# Patient Record
Sex: Female | Born: 1965 | Race: White | Hispanic: No | Marital: Married
Health system: Southern US, Community
[De-identification: ages and names within clinical notes are randomized; demographics above are authoritative.]

## PROBLEM LIST (undated history)

## (undated) DIAGNOSIS — E119 Type 2 diabetes mellitus without complications: Secondary | ICD-10-CM

---

## 2020-09-05 ENCOUNTER — Encounter (HOSPITAL_COMMUNITY): Payer: Self-pay | Admitting: Emergency Medicine

## 2020-09-05 ENCOUNTER — Emergency Department (HOSPITAL_COMMUNITY): Payer: BLUE CROSS/BLUE SHIELD

## 2020-09-05 ENCOUNTER — Emergency Department (HOSPITAL_COMMUNITY)
Admission: EM | Admit: 2020-09-05 | Discharge: 2020-09-06 | Disposition: A | Discharge status: Home / Self Care | Payer: BLUE CROSS/BLUE SHIELD | Attending: Emergency Medicine | Admitting: Emergency Medicine

## 2020-09-05 ENCOUNTER — Other Ambulatory Visit: Payer: Self-pay

## 2020-09-05 DIAGNOSIS — Z794 Long term (current) use of insulin: Secondary | ICD-10-CM | POA: Diagnosis not present

## 2020-09-05 DIAGNOSIS — R0789 Other chest pain: Secondary | ICD-10-CM

## 2020-09-05 DIAGNOSIS — E119 Type 2 diabetes mellitus without complications: Secondary | ICD-10-CM | POA: Insufficient documentation

## 2020-09-05 HISTORY — DX: Type 2 diabetes mellitus without complications: E11.9

## 2020-09-05 LAB — CBC
HCT: 40.3 % (ref 36.0–46.0)
Hemoglobin: 13.4 g/dL (ref 12.0–15.0)
MCH: 30 pg (ref 26.0–34.0)
MCHC: 33.3 g/dL (ref 30.0–36.0)
MCV: 90.4 fL (ref 80.0–100.0)
Platelets: 256 10*3/uL (ref 150–400)
RBC: 4.46 MIL/uL (ref 3.87–5.11)
RDW: 12.4 % (ref 11.5–15.5)
WBC: 8.5 10*3/uL (ref 4.0–10.5)
nRBC: 0 % (ref 0.0–0.2)

## 2020-09-05 LAB — I-STAT BETA HCG BLOOD, ED (MC, WL, AP ONLY): I-stat hCG, quantitative: <5 m[IU]/mL (ref <5)

## 2020-09-05 LAB — HEPATIC FUNCTION PANEL
ALT: 21 U/L (ref 0–44)
AST: 28 U/L (ref 15–41)
Albumin: 3.7 g/dL (ref 3.5–5.0)
Alkaline Phosphatase: 40 U/L (ref 38–126)
Bilirubin, Direct: 0.1 mg/dL (ref 0.0–0.2)
Indirect Bilirubin: 0.5 mg/dL (ref 0.3–0.9)
Total Bilirubin: 0.6 mg/dL (ref 0.3–1.2)
Total Protein: 6.7 g/dL (ref 6.5–8.1)

## 2020-09-05 LAB — TROPONIN I (HIGH SENSITIVITY)
Troponin I (High Sensitivity): 11 ng/L (ref <18)
Troponin I (High Sensitivity): 12 ng/L (ref <18)

## 2020-09-05 LAB — BASIC METABOLIC PANEL
Anion gap: 9 (ref 5–15)
BUN: 12 mg/dL (ref 6–20)
CO2: 25 mmol/L (ref 22–32)
Calcium: 9.5 mg/dL (ref 8.9–10.3)
Chloride: 104 mmol/L (ref 98–111)
Creatinine, Ser: 0.86 mg/dL (ref 0.44–1.00)
GFR, Estimated: >60 mL/min (ref >60)
Glucose, Bld: 189 mg/dL — ABNORMAL HIGH (ref 70–99)
Potassium: 3.9 mmol/L (ref 3.5–5.1)
Sodium: 138 mmol/L (ref 135–145)

## 2020-09-05 LAB — LIPASE, BLOOD: Lipase: 44 U/L (ref 11–51)

## 2020-09-05 LAB — D-DIMER, QUANTITATIVE: D-Dimer, Quant: 0.37 ug/mL-FEU (ref 0.00–0.50)

## 2020-09-05 MED ORDER — LIDOCAINE 5 % EX PTCH: 1.0000 | MEDICATED_PATCH | Freq: Once | CUTANEOUS | Status: DC

## 2020-09-05 MED ORDER — FAMOTIDINE 20 MG PO TABS: 20.0000 mg | ORAL_TABLET | Freq: Two times a day (BID) | ORAL | 0 refills | Status: AC

## 2020-09-05 MED ORDER — IBUPROFEN 400 MG PO TABS
400.0000 mg | ORAL_TABLET | Freq: Once | ORAL | Status: AC | PRN
  Administered 2020-09-05: 400 mg via ORAL
  Filled 2020-09-05: qty 1

## 2020-09-05 MED ORDER — SUCRALFATE 1 G PO TABS: 1.0000 g | ORAL_TABLET | Freq: Three times a day (TID) | ORAL | 0 refills | Status: AC

## 2020-09-05 NOTE — ED Provider Notes (Signed)
[facility] EMERGENCY DEPARTMENT Provider Note   CSN: [id number] Arrival date & time: 09/05/20  1431     History Chief Complaint  Patient presents with  . Chest Pain    Karla Dalton is a 54 y.o. female.  HPI    Patient presents with concern of chest pain.  More accurately, the patient has pain in the sternal area with radiation posteriorly to the region between her scapula. She notes that she has had pain on and off for the past month, in this distribution. She denies preceding trauma, fall, lifting, accident. Pain has been on, off, enough that she has seen her physician, has had outpatient testing, including ultrasound of her gallbladder.  No cardiac testing thus far. Patient does not smoke, does not drink. 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 No known cardiac history. Today, with worsening of the pain, in a typical distribution, sternal, intrascapular, severe, she presents for evaluation. Pain is improved with sublingual nitroglycerin provided soon after arrival.  Past Medical History:  Diagnosis Date  . Diabetes mellitus without complication (HCC)     There are no problems to display for this patient.   History reviewed. No pertinent surgical history.   OB History   No obstetric history on file.     No family history on file.  Social History   Tobacco Use  . Smoking status: Not on file  Substance Use Topics  . Alcohol use: Not on file  . Drug use: Not on file    Home Medications Prior to Admission medications   Medication Sig Start Date End Date Taking? Authorizing Provider  acetaminophen (TYLENOL) 500 MG tablet Take 1,000 mg by mouth every 8 (eight) hours as needed for moderate pain.   Yes [provider]  Insulin Regular Human (NOVOLIN R FLEXPEN) 100 UNIT/ML SOPN Inject 15 Units into the muscle in the morning and at bedtime. 07/24/20  Yes [provider]  TOUJEO MAX SOLOSTAR 300 UNIT/ML Solostar Pen  Inject 36 Units into the skin every morning. 08/14/20  Yes [provider]  famotidine (PEPCID) 20 MG tablet Take 1 tablet (20 mg total) by mouth 2 (two) times daily. 09/05/20   Gerhard Munch, MD  sucralfate (CARAFATE) 1 g tablet Take 1 tablet (1 g total) by mouth 4 (four) times daily -  with meals and at bedtime. 09/05/20   Gerhard Munch, MD    Allergies    Penicillins, Sulfa antibiotics, and Aspirin  Review of Systems   Review of Systems  Constitutional:       Per HPI, otherwise negative  HENT:       Per HPI, otherwise negative  Respiratory:       Per HPI, otherwise negative  Cardiovascular:       Per HPI, otherwise negative  Gastrointestinal: Negative for vomiting.  Endocrine:       Negative aside from HPI  Genitourinary:       Neg aside from HPI   Musculoskeletal:       Per HPI, otherwise negative  Skin: Negative.   Neurological: Negative for syncope.    Physical Exam Updated Vital Signs BP 136/75   Pulse (!) 53   Temp 97.9 F (36.6 C) (Oral)   Resp 15   LMP 09/18/2019 (Approximate) Comment: approximate-per pt  SpO2 98%   Physical Exam Vitals and nursing note reviewed.  Constitutional:      General: She is not in acute distress.    Appearance: She is well-developed.  She is obese. She is not ill-appearing or diaphoretic.  HENT:     Head: Normocephalic and atraumatic.  Eyes:     Conjunctiva/sclera: Conjunctivae normal.  Cardiovascular:     Rate and Rhythm: Normal rate and regular rhythm.  Pulmonary:     Effort: Pulmonary effort is normal. No respiratory distress.     Breath sounds: Normal breath sounds. No stridor.  Abdominal:     General: There is no distension.  Skin:    General: Skin is warm and dry.  Neurological:     Mental Status: She is alert and oriented to person, place, and time.     Cranial Nerves: No cranial nerve deficit.     ED Results / Procedures / Treatments   Labs (all labs ordered are listed, but only abnormal  results are displayed) Labs Reviewed  BASIC METABOLIC PANEL - Abnormal; Notable for the following components:      Result Value   Glucose, Bld 189 (*)    All other components within normal limits  CBC  D-DIMER, QUANTITATIVE (NOT AT [facility])  LIPASE, BLOOD  HEPATIC FUNCTION PANEL  I-STAT BETA HCG BLOOD, ED ([facility], [facility], [facility] ONLY)  TROPONIN I (HIGH SENSITIVITY)  TROPONIN I (HIGH SENSITIVITY)    EKG EKG Interpretation  Date/Time:  Friday September 05 2020 14:27:55 EST Ventricular Rate:  59 PR Interval:  164 QRS Duration: 82 QT Interval:  416 QTC Calculation: 411 R Axis:   -27 Text Interpretation: Sinus bradycardia Minimal voltage criteria for LVH, may be normal variant ( R in aVL ) Borderline ECG Confirmed by Gerhard Munch [phone]) on 09/05/2020 8:21:44 PM   Radiology DG Chest 2 View  Result Date: 09/05/2020 CLINICAL DATA:  54 year old female with chest pain. EXAM: CHEST - 2 VIEW COMPARISON:  None. FINDINGS: The heart size and mediastinal contours are within normal limits. Both lungs are clear. The visualized skeletal structures are unremarkable. IMPRESSION: No active cardiopulmonary disease. Electronically Signed   By: Elgie Collard M.D.   On: 09/05/2020 15:53   CT Thoracic Spine Wo Contrast  Result Date: 09/05/2020 CLINICAL DATA:  Initial evaluation for possible compression fracture. EXAM: CT THORACIC SPINE WITHOUT CONTRAST TECHNIQUE: Multidetector CT images of the thoracic were obtained using the standard protocol without intravenous contrast. COMPARISON:  None available. Prior radiograph from earlier same day. FINDINGS: Alignment: Vertebral bodies normally aligned with preservation of the normal thoracic kyphosis. No listhesis. Vertebrae: Vertebral body height maintained without acute or chronic fracture. No discrete or worrisome osseous lesions. Visualized ribs intact. Paraspinal and other soft tissues: Paraspinous soft tissues demonstrate no acute finding. Partially visualized lungs  are clear. Disc levels: T6-7: Small right central disc osteophyte complex indents the right ventral thecal sac without significant stenosis. Foramina remain patent. T11-12: Broad-based central disc osteophyte complex flattens the ventral thecal sac. Suspected mild spinal stenosis. Foramina remain patent. No other significant disc pathology seen within the thoracic spine. No other significant stenosis. Additional scattered reactive endplate spurring noted throughout the mid and lower thoracic spine. IMPRESSION: 1. No acute fracture or other abnormality within the thoracic spine. 2. Broad-based central disc osteophyte complex at T11-12 with resultant mild spinal stenosis. 3. Small right central disc osteophyte complex at T6-7 without significant stenosis. Electronically Signed   By: Rise Mu M.D.   On: 09/05/2020 23:07    Procedures Procedures (including critical care time)  Medications Ordered in ED Medications  lidocaine (LIDODERM) 5 % 1 patch (has no administration in time range)  ibuprofen (ADVIL) tablet 400  mg (400 mg Oral Given 09/05/20 1655)    ED Course  I have reviewed the triage vital signs and the nursing notes.  Pertinent labs & imaging results that were available during my care of the patient were reviewed by me and considered in my medical decision making (see chart for details).  Update:, After initial x-ray was reviewed, discussed, patient continues to complain of pain. With consideration of possible compression fracture, CT scan ordered. 11:29 PM Patient in no distress, hemodynamically unremarkable. Now I have reviewed the CT images, the remaining labs. Labs notable for unremarkable troponin values, and with nonischemic EKG, duration of symptoms, low suspicion for atypical ACS. D-dimer negative, low suspicion for PE or dissection. Lipase negative, no evidence for hepatobiliary dysfunction. On the labs reassuring. Some suspicion for the patient's pain being  gastroesophageal.  She is not currently taking a regimen, will start this, will follow up with gastroenterology.  Final Clinical Impression(s) / ED Diagnoses Final diagnoses:  Atypical chest pain    Rx / DC Orders ED Discharge Orders         Ordered    sucralfate (CARAFATE) 1 g tablet  3 times daily with meals & bedtime       Note to Pharmacy: Take for one week   09/05/20 2328    famotidine (PEPCID) 20 MG tablet  2 times daily        09/05/20 2328           Gerhard Munch, MD 09/05/20 2330

## 2020-09-05 NOTE — ED Triage Notes (Signed)
Pt arrives via [facility] from work with c/o sudden onset of chest pain that radiated to her back. Denies sob or radiation of pain to arms. BP 193/100 initially then 163/87 with 1 sl nitro. No asa due to allergy. Normal 12 lead ekg. 18g IV L arm.

## 2020-09-05 NOTE — Discharge Instructions (Signed)
As discussed, your evaluation today has been largely reassuring.  But, it is important that you monitor your condition carefully, and do not hesitate to return to the ED if you develop new, or concerning changes in your condition. ? ?Otherwise, please follow-up with your physician for appropriate ongoing care. ? ?

## 2020-09-06 ENCOUNTER — Other Ambulatory Visit: Payer: Self-pay

## 2020-09-09 ENCOUNTER — Encounter (HOSPITAL_COMMUNITY): Payer: Self-pay

## 2020-09-09 ENCOUNTER — Other Ambulatory Visit: Payer: Self-pay

## 2020-09-09 ENCOUNTER — Emergency Department (HOSPITAL_COMMUNITY): Payer: BLUE CROSS/BLUE SHIELD

## 2020-09-09 ENCOUNTER — Emergency Department (HOSPITAL_COMMUNITY)
Admission: EM | Admit: 2020-09-09 | Discharge: 2020-09-09 | Disposition: A | Discharge status: Home / Self Care | Payer: BLUE CROSS/BLUE SHIELD | Attending: Emergency Medicine | Admitting: Emergency Medicine

## 2020-09-09 DIAGNOSIS — E119 Type 2 diabetes mellitus without complications: Secondary | ICD-10-CM | POA: Diagnosis not present

## 2020-09-09 DIAGNOSIS — R079 Chest pain, unspecified: Secondary | ICD-10-CM | POA: Diagnosis present

## 2020-09-09 DIAGNOSIS — Z794 Long term (current) use of insulin: Secondary | ICD-10-CM | POA: Insufficient documentation

## 2020-09-09 DIAGNOSIS — R0789 Other chest pain: Secondary | ICD-10-CM | POA: Insufficient documentation

## 2020-09-09 LAB — CBC
HCT: 40.6 % (ref 36.0–46.0)
Hemoglobin: 13.1 g/dL (ref 12.0–15.0)
MCH: 29.5 pg (ref 26.0–34.0)
MCHC: 32.3 g/dL (ref 30.0–36.0)
MCV: 91.4 fL (ref 80.0–100.0)
Platelets: 242 10*3/uL (ref 150–400)
RBC: 4.44 MIL/uL (ref 3.87–5.11)
RDW: 12.4 % (ref 11.5–15.5)
WBC: 8 10*3/uL (ref 4.0–10.5)
nRBC: 0 % (ref 0.0–0.2)

## 2020-09-09 LAB — BASIC METABOLIC PANEL
Anion gap: 11 (ref 5–15)
BUN: 11 mg/dL (ref 6–20)
CO2: 24 mmol/L (ref 22–32)
Calcium: 9.2 mg/dL (ref 8.9–10.3)
Chloride: 104 mmol/L (ref 98–111)
Creatinine, Ser: 0.81 mg/dL (ref 0.44–1.00)
GFR, Estimated: >60 mL/min (ref >60)
Glucose, Bld: 201 mg/dL — ABNORMAL HIGH (ref 70–99)
Potassium: 3.9 mmol/L (ref 3.5–5.1)
Sodium: 139 mmol/L (ref 135–145)

## 2020-09-09 LAB — TROPONIN I (HIGH SENSITIVITY)
Troponin I (High Sensitivity): 11 ng/L (ref <18)
Troponin I (High Sensitivity): 12 ng/L (ref <18)

## 2020-09-09 LAB — CBG MONITORING, ED: Glucose-Capillary: 123 mg/dL — ABNORMAL HIGH (ref 70–99)

## 2020-09-09 MED ORDER — ACETAMINOPHEN 500 MG PO TABS
1000.0000 mg | ORAL_TABLET | Freq: Once | ORAL | Status: AC
  Administered 2020-09-09: 1000 mg via ORAL
  Filled 2020-09-09: qty 2

## 2020-09-09 MED ORDER — SODIUM CHLORIDE 0.9 % IV BOLUS
1000.0000 mL | Freq: Once | INTRAVENOUS | Status: AC
  Administered 2020-09-09: 1000 mL via INTRAVENOUS

## 2020-09-09 NOTE — ED Triage Notes (Signed)
Pt reports chest pressure that radiates to her back along with HTN up to 200. Pt seen last week for the same and has a follow up appointment with cardiology next week. Pt anxious in triage

## 2020-09-09 NOTE — Discharge Instructions (Addendum)
Please follow-up with your primary care provider for further management of your condition.  Follow-up with GI specialist as previously recommended.  Return to the ED if you have any concern however your evaluation in the ED today have been reassuring.

## 2020-09-09 NOTE — ED Provider Notes (Signed)
[facility] EMERGENCY DEPARTMENT Provider Note   CSN: [id number] Arrival date & time: 09/09/20  1319     History Chief Complaint  Patient presents with  . Chest Pain  . Hypertension    Karla Dalton is a 54 y.o. female.  The history is provided by the patient and medical records. No language interpreter was used.  Chest Pain Hypertension Associated symptoms include chest pain.     54 year old female significant history of diabetes presenting for evaluation of chest pain. Patient reports several days ago while at work she had a syncopal episode. She was brought to the ED for evaluation and was found to be hypertensive with systolic blood pressure in the 200s. Evaluation during her visit include a D-dimer that was negative therefore low suspicion for PE or dissection, her EKG and troponin was unremarkable, low suspicion for atypical ACS, CT scan of her chest was reassuring. It was thought that her symptoms may be secondary to gastroesophageal source. Patient discharged home with Carafate and a referral for gastroenterology. Today while at work she experienced a near syncopal episode with the same pain in his chest. She described pain as a substernal sensation, radiates towards her back, intense, and also report that her blood pressure was high. She voiced concern for high blood pressure thus prompting this ER visit. 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 She mention history of diabetes which is well controlled. She denies any recent injury. She has not been vaccinated for COVID-19.  Past Medical History:  Diagnosis Date  . Diabetes mellitus without complication (HCC)     There are no problems to display for this patient.   History reviewed. No pertinent surgical history.   OB History   No obstetric history on file.     No family history on file.  Social History   Tobacco Use  . Smoking status: Not on file  Substance  Use Topics  . Alcohol use: Not on file  . Drug use: Not on file    Home Medications Prior to Admission medications   Medication Sig Start Date End Date Taking? Authorizing Provider  acetaminophen (TYLENOL) 500 MG tablet Take 1,000 mg by mouth every 8 (eight) hours as needed for moderate pain.    [provider]  famotidine (PEPCID) 20 MG tablet Take 1 tablet (20 mg total) by mouth 2 (two) times daily. 09/05/20   Gerhard Munch, MD  Insulin Regular Human (NOVOLIN R FLEXPEN) 100 UNIT/ML SOPN Inject 15 Units into the muscle in the morning and at bedtime. 07/24/20   [provider]  sucralfate (CARAFATE) 1 g tablet Take 1 tablet (1 g total) by mouth 4 (four) times daily -  with meals and at bedtime. 09/05/20   Gerhard Munch, MD  TOUJEO MAX SOLOSTAR 300 UNIT/ML Solostar Pen Inject 36 Units into the skin every morning. 08/14/20   [provider]    Allergies    Penicillins, Sulfa antibiotics, and Aspirin  Review of Systems   Review of Systems  Cardiovascular: Positive for chest pain.  All other systems reviewed and are negative.   Physical Exam Updated Vital Signs BP 139/69   Pulse 63   Temp 98.1 F (36.7 C) (Oral)   Resp 20   Ht 5\' 7"  (1.702 m)   Wt 106.6 kg   SpO2 100%   BMI 36.81 kg/m   Physical Exam Vitals and nursing note reviewed.  Constitutional:  General: She is not in acute distress.    Appearance: She is well-developed.  HENT:     Head: Atraumatic.  Eyes:     Conjunctiva/sclera: Conjunctivae normal.  Neck:     Thyroid: No thyromegaly.  Cardiovascular:     Rate and Rhythm: Normal rate and regular rhythm.     Heart sounds: Normal heart sounds.  Pulmonary:     Effort: Pulmonary effort is normal.     Breath sounds: Normal breath sounds. No decreased breath sounds, wheezing, rhonchi or rales.  Chest:     Chest wall: No tenderness.  Abdominal:     Palpations: Abdomen is soft.     Tenderness: There is no abdominal tenderness.    Musculoskeletal:        General: Normal range of motion.     Cervical back: Normal range of motion and neck supple.     Right lower leg: No edema.     Left lower leg: No edema.  Skin:    Findings: No rash.  Neurological:     Mental Status: She is alert and oriented to person, place, and time.     GCS: GCS eye subscore is 4. GCS verbal subscore is 5. GCS motor subscore is 6.     ED Results / Procedures / Treatments   Labs (all labs ordered are listed, but only abnormal results are displayed) Labs Reviewed  BASIC METABOLIC PANEL - Abnormal; Notable for the following components:      Result Value   Glucose, Bld 201 (*)    All other components within normal limits  CBG MONITORING, ED - Abnormal; Notable for the following components:   Glucose-Capillary 123 (*)    All other components within normal limits  CBC  TROPONIN I (HIGH SENSITIVITY)  TROPONIN I (HIGH SENSITIVITY)    EKG None   Date: 09/09/2020  Rate: 63  Rhythm: normal sinus rhythm  QRS Axis: normal  Intervals: normal  ST/T Wave abnormalities: normal  Conduction Disutrbances: none  Narrative Interpretation:   Old EKG Reviewed: No significant changes noted     Radiology DG Chest 2 View  Result Date: 09/09/2020 CLINICAL DATA:  Chest pain EXAM: CHEST - 2 VIEW COMPARISON:  09/05/2020 FINDINGS: The heart size and mediastinal contours are within normal limits. No focal airspace consolidation, pleural effusion, or pneumothorax. The visualized skeletal structures are unremarkable. IMPRESSION: No active cardiopulmonary disease. Electronically Signed   By: Duanne Guess D.O.   On: 09/09/2020 14:00    Procedures Procedures (including critical care time)  Medications Ordered in ED Medications  sodium chloride 0.9 % bolus 1,000 mL (1,000 mLs Intravenous New Bag/Given 09/09/20 1815)  acetaminophen (TYLENOL) tablet 1,000 mg (1,000 mg Oral Given 09/09/20 1819)    ED Course  I have reviewed the triage vital signs  and the nursing notes.  Pertinent labs & imaging results that were available during my care of the patient were reviewed by me and considered in my medical decision making (see chart for details).    MDM Rules/Calculators/A&P                          BP 139/69   Pulse 63   Temp 98.1 F (36.7 C) (Oral)   Resp 20   Ht 5\' 7"  (1.702 m)   Wt 106.6 kg   SpO2 100%   BMI 36.81 kg/m   Final Clinical Impression(s) / ED Diagnoses Final diagnoses:  Atypical chest pain  Rx / DC Orders ED Discharge Orders    None     5:48 PM Patient here with complaints of chest pain, near syncope, and elevated blood pressure. Was seen several days prior for similar complaint and had a pretty thorough work-up that was unremarkable. Felt symptoms may be GI related and patient does have an appointment to follow-up with gastroenterology. She is here again with concerning for potential heart injuries causing her symptoms. She is concerned that her blood pressure is too high. Her current blood pressure is 139/69 without any specific treatment. Vital signs stable. Labs are reassuring.  6:25 PM EKG without concerning changes, normal troponin, electrolyte panels are reassuring, normal H&H, normal WBC,Chest x-ray unremarkable and vital signs stable.  Normal orthostatic vital sign.  Patient was given Tylenol for her headache.  She also received IV fluid while waiting.  6:32 PM Encourage patient to follow-up with PCP for further evaluation.  She can also follow-up with GI specialist as previously recommended.  Return precaution discussed.   Fayrene Helper, PA-C 09/09/20 1832    Gwyneth Sprout, MD 09/10/20 [phone]

## 2021-09-16 IMAGING — DX DG CHEST 2V
2 series · 2 of 2 positions shown · non-contrast
Comparison: None.

CLINICAL DATA: 54-year-old female with chest pain.

EXAM:
CHEST - 2 VIEW

[chest pa]
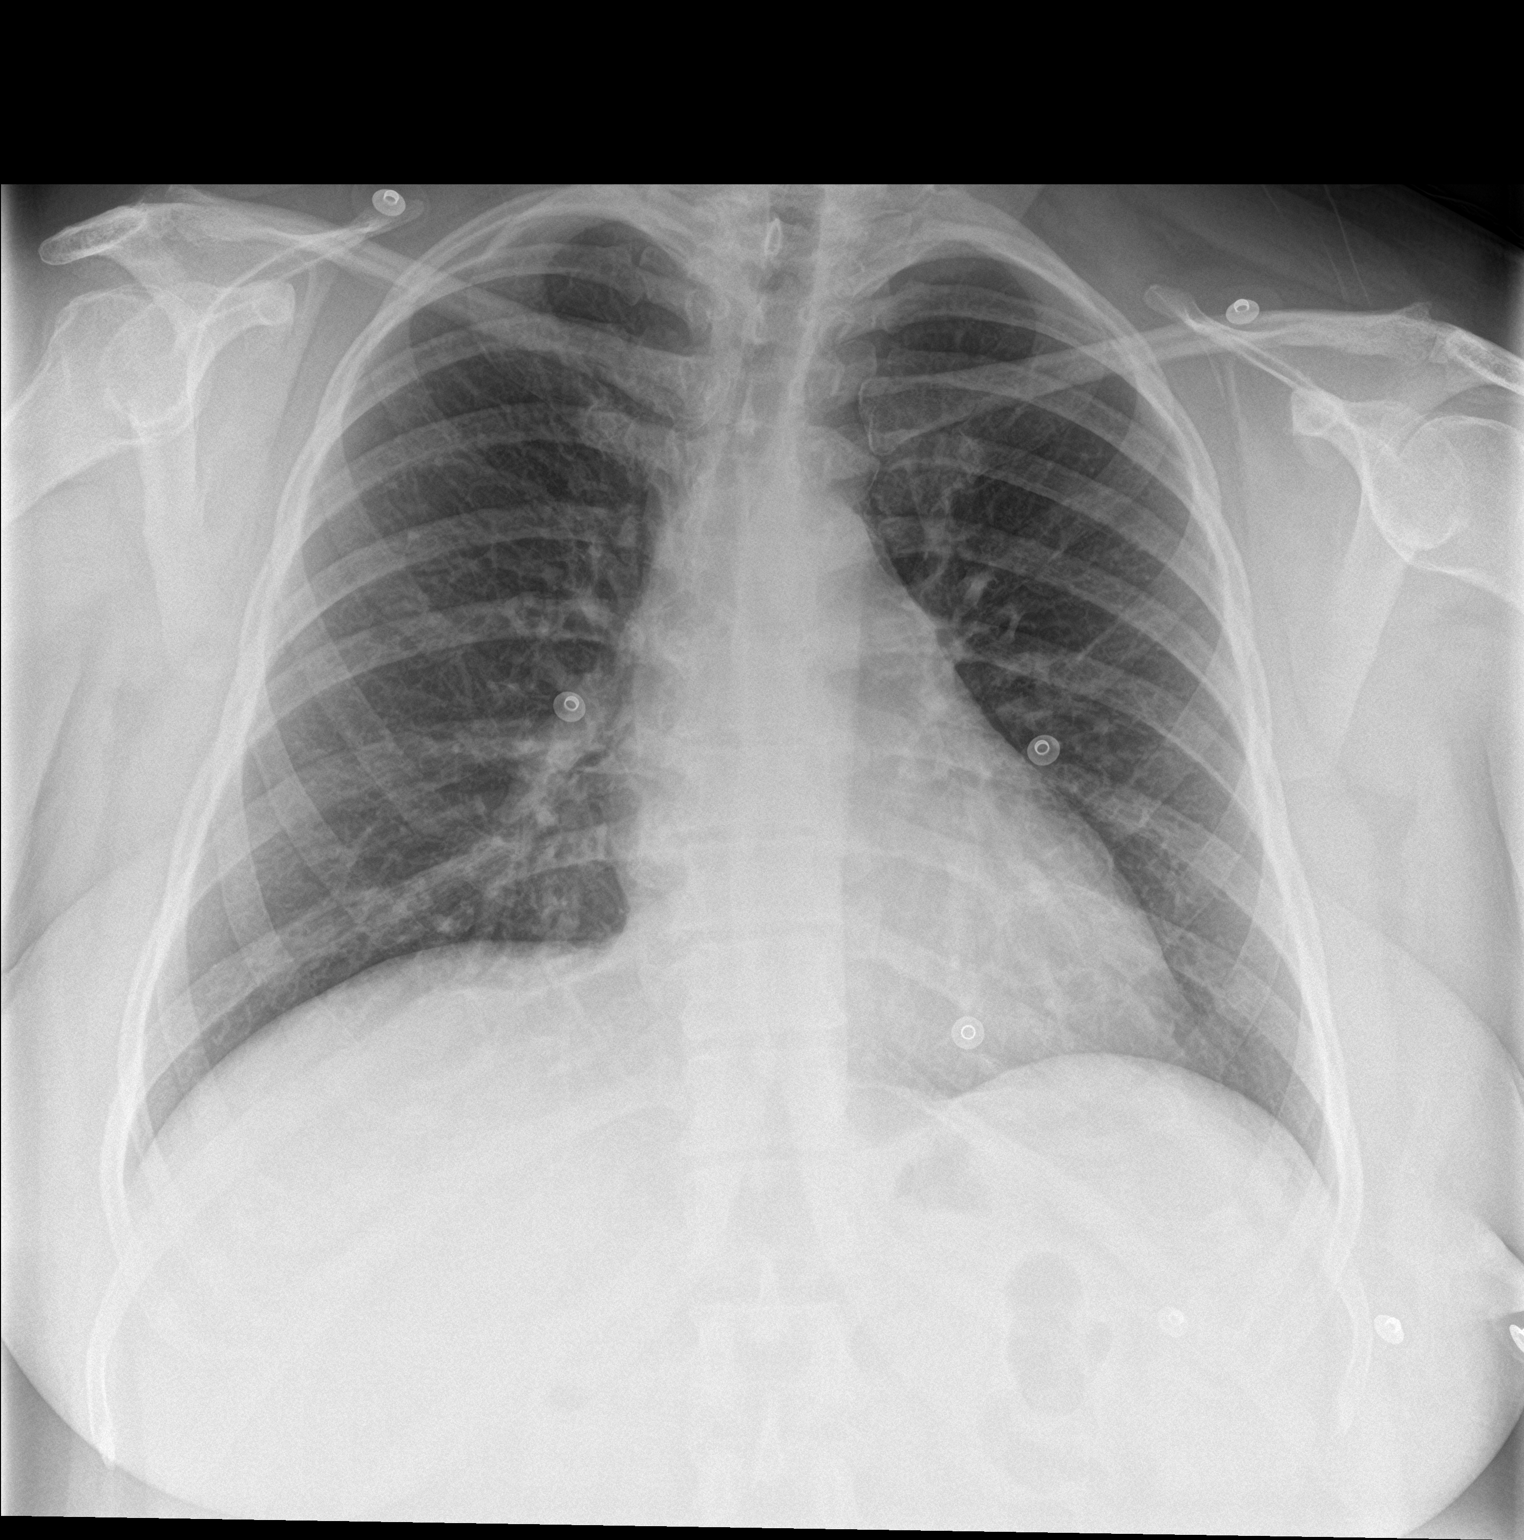

[chest lat]
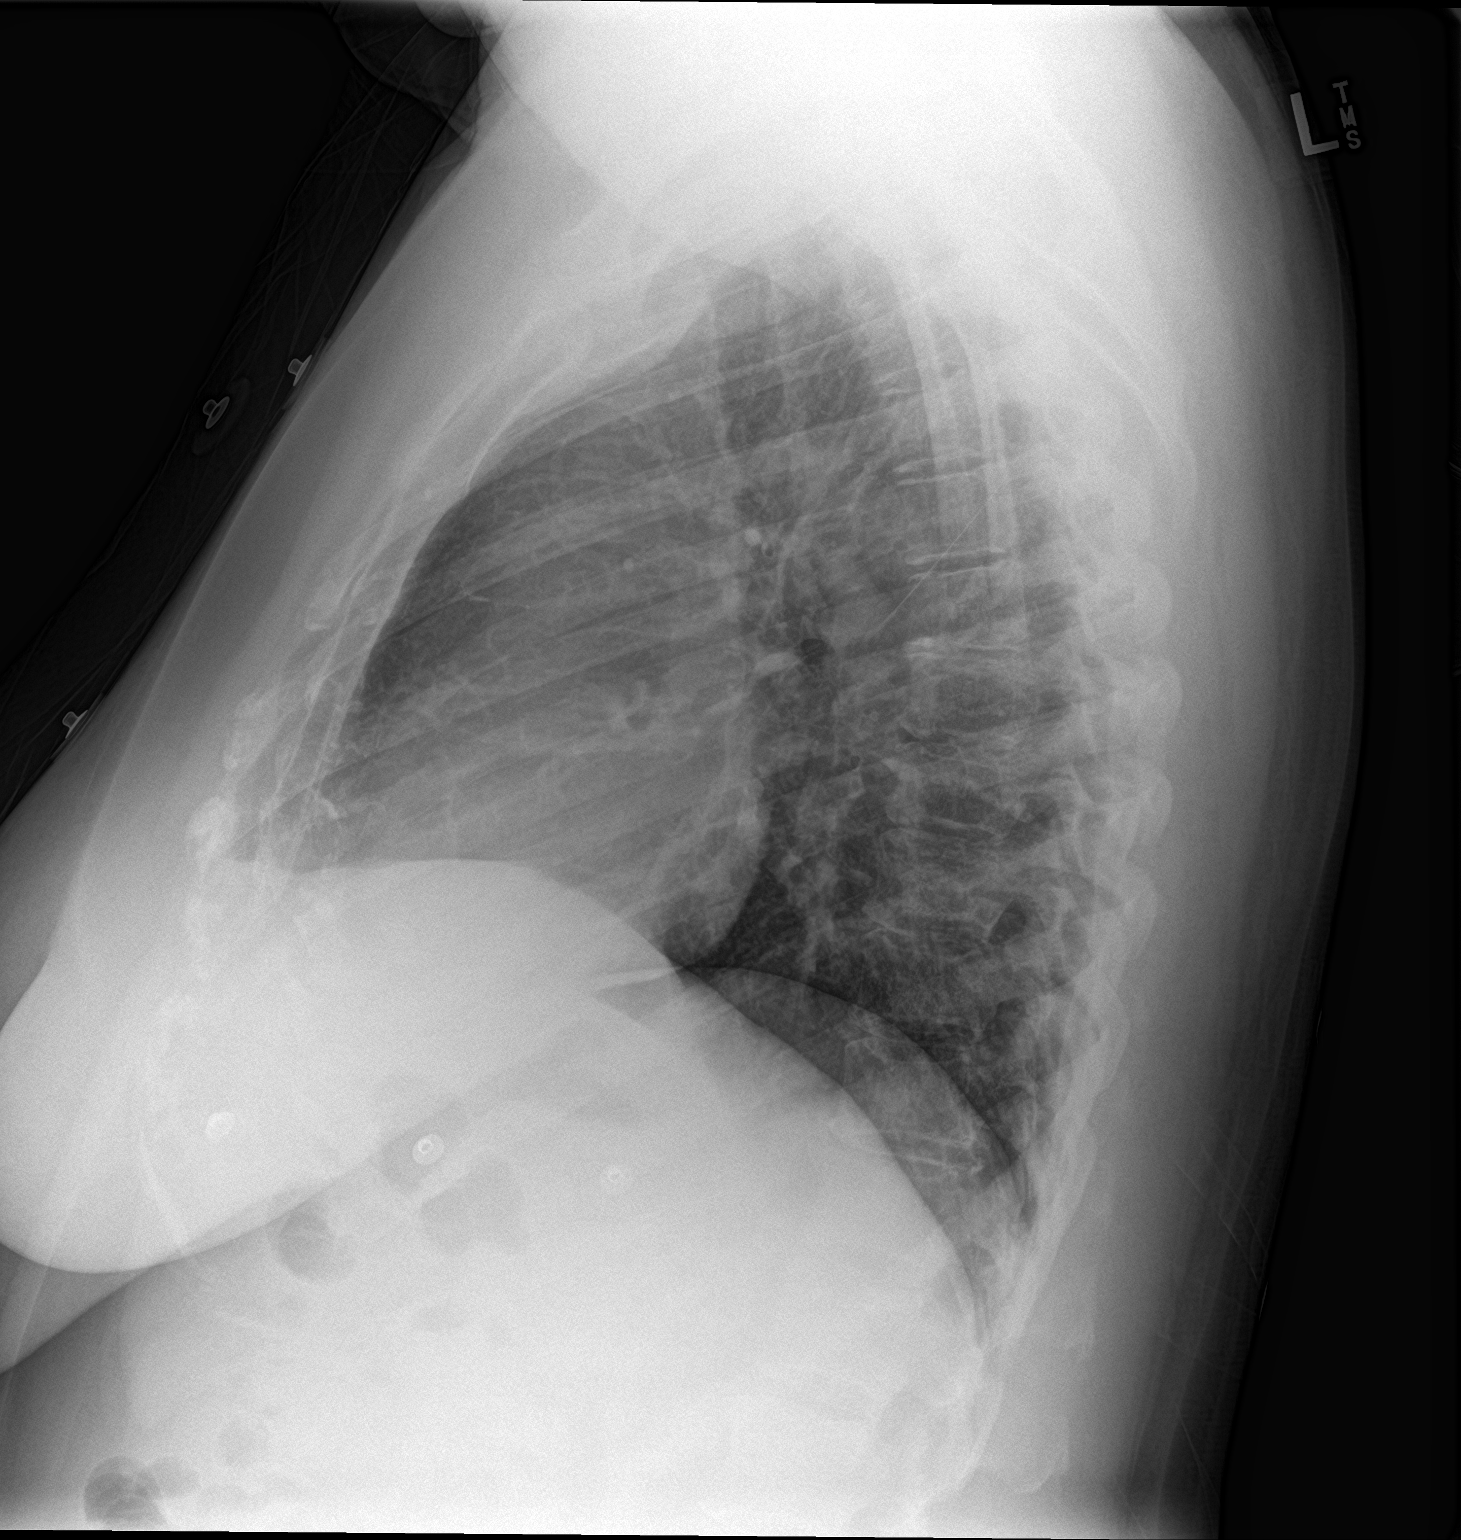

[2 of 2 positions shown; findings below may reference images not displayed]

FINDINGS: The heart size and mediastinal contours are within normal limits.
Both lungs are clear. The visualized skeletal structures are
unremarkable.
IMPRESSION: No active cardiopulmonary disease.

## 2021-09-20 IMAGING — CR DG CHEST 2V
2 series · 2 of 2 positions shown · non-contrast
Comparison: 09/05/2020

CLINICAL DATA: Chest pain

EXAM:
CHEST - 2 VIEW

[chest pa]
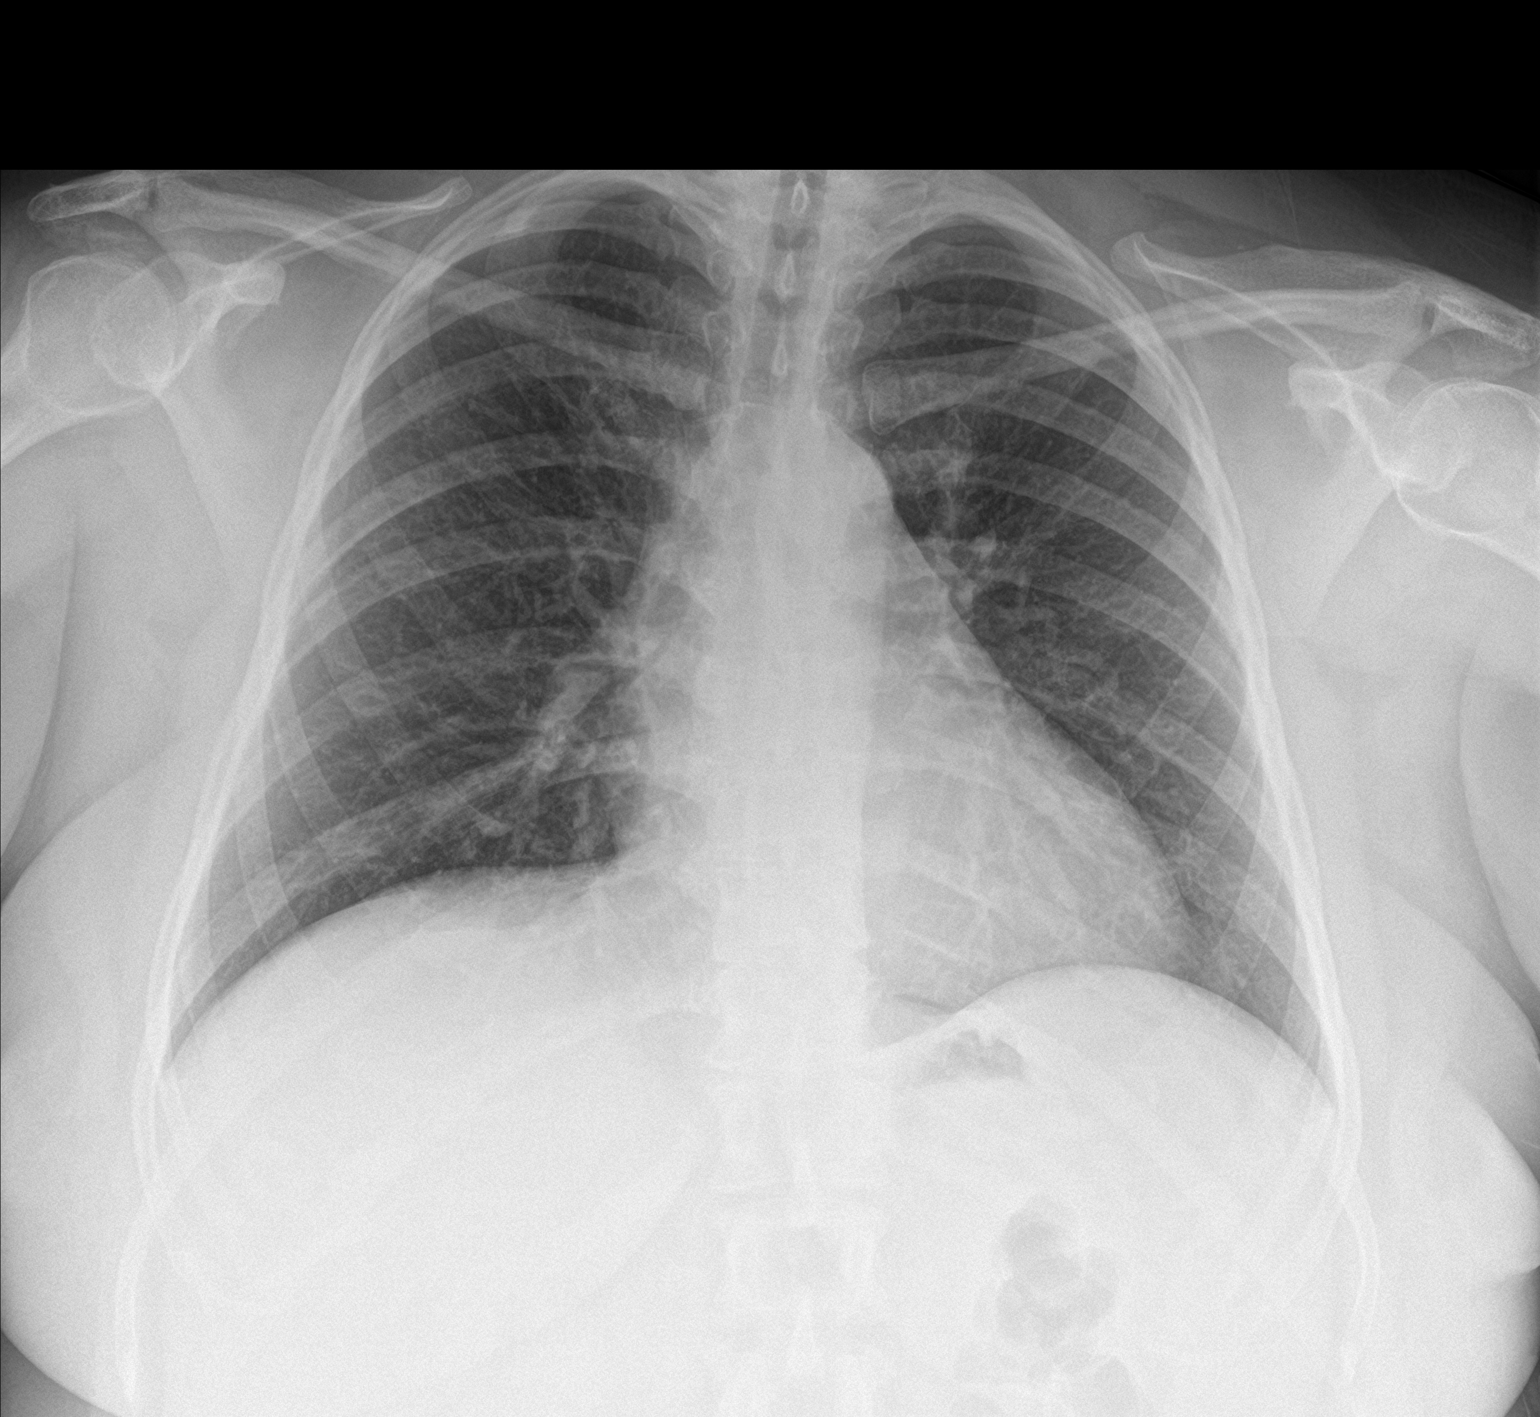

[chest lat]
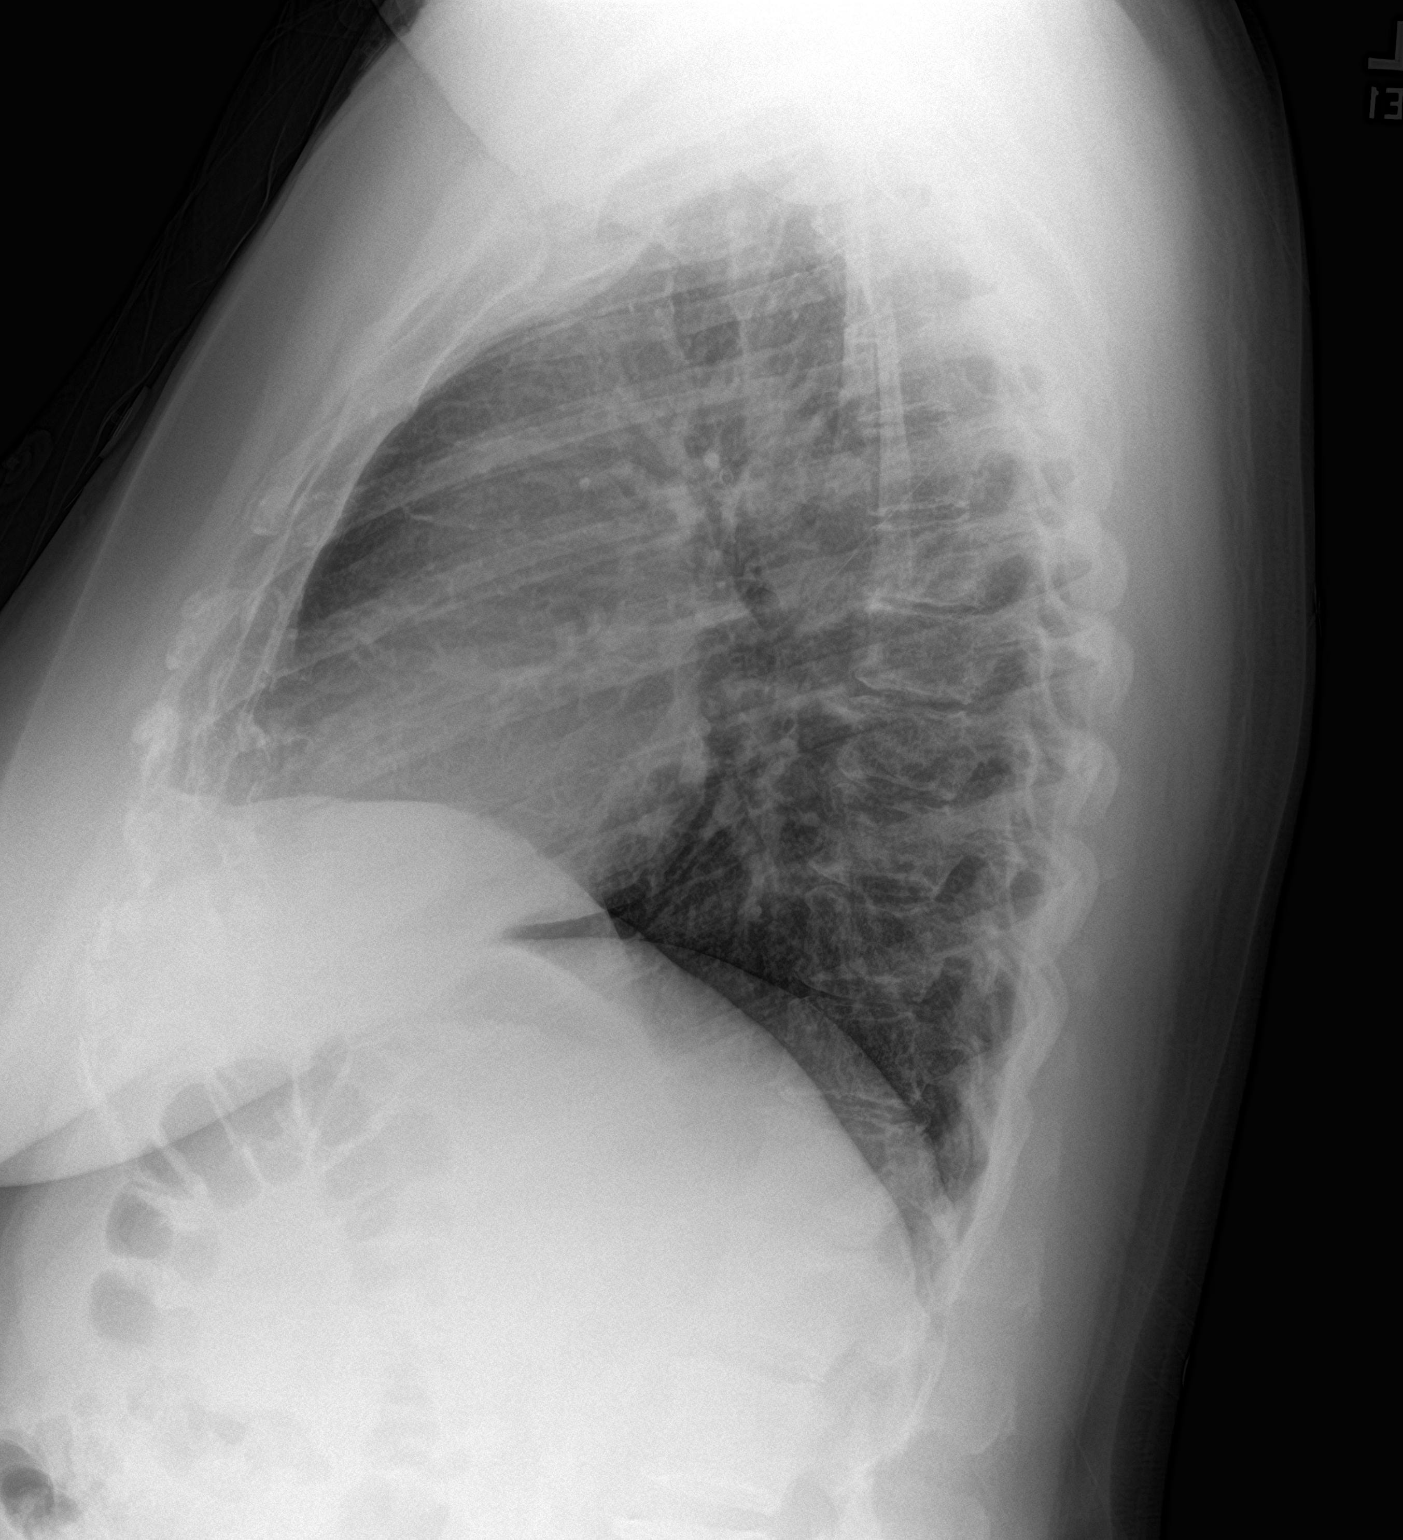

[2 of 2 positions shown; findings below may reference images not displayed]

FINDINGS: The heart size and mediastinal contours are within normal limits. No
focal airspace consolidation, pleural effusion, or pneumothorax. The
visualized skeletal structures are unremarkable.
IMPRESSION: No active cardiopulmonary disease.
